# Patient Record
Sex: Male | Born: 1990 | Hispanic: Yes | Marital: Single | Smoking: Current every day smoker
Health system: Southern US, Community
[De-identification: ages and names within clinical notes are randomized; demographics above are authoritative.]

---

## 2009-04-30 ENCOUNTER — Emergency Department (HOSPITAL_COMMUNITY): Admission: EM | Admit: 2009-04-30 | Discharge: 2009-04-30 | Payer: Self-pay | Admitting: Emergency Medicine

## 2009-07-16 ENCOUNTER — Emergency Department (HOSPITAL_COMMUNITY): Admission: EM | Admit: 2009-07-16 | Discharge: 2009-07-16 | Payer: Self-pay | Admitting: Emergency Medicine

## 2009-07-21 ENCOUNTER — Emergency Department (HOSPITAL_COMMUNITY): Admission: EM | Admit: 2009-07-21 | Discharge: 2009-07-21 | Payer: Self-pay | Admitting: Emergency Medicine

## 2009-08-10 ENCOUNTER — Emergency Department (HOSPITAL_COMMUNITY): Admission: EM | Admit: 2009-08-10 | Discharge: 2009-08-10 | Payer: Self-pay | Admitting: Emergency Medicine

## 2010-01-20 ENCOUNTER — Emergency Department (HOSPITAL_COMMUNITY): Admission: EM | Admit: 2010-01-20 | Discharge: 2010-01-20 | Payer: Self-pay | Admitting: Emergency Medicine

## 2010-11-08 IMAGING — CR DG CHEST 2V
2 series · 2 of 2 positions shown · non-contrast
Comparison: None

CLINICAL DATA: Chest pain and shortness of breath; asthma and
history of smoking.

CHEST - 2 VIEW

[w chest pa]
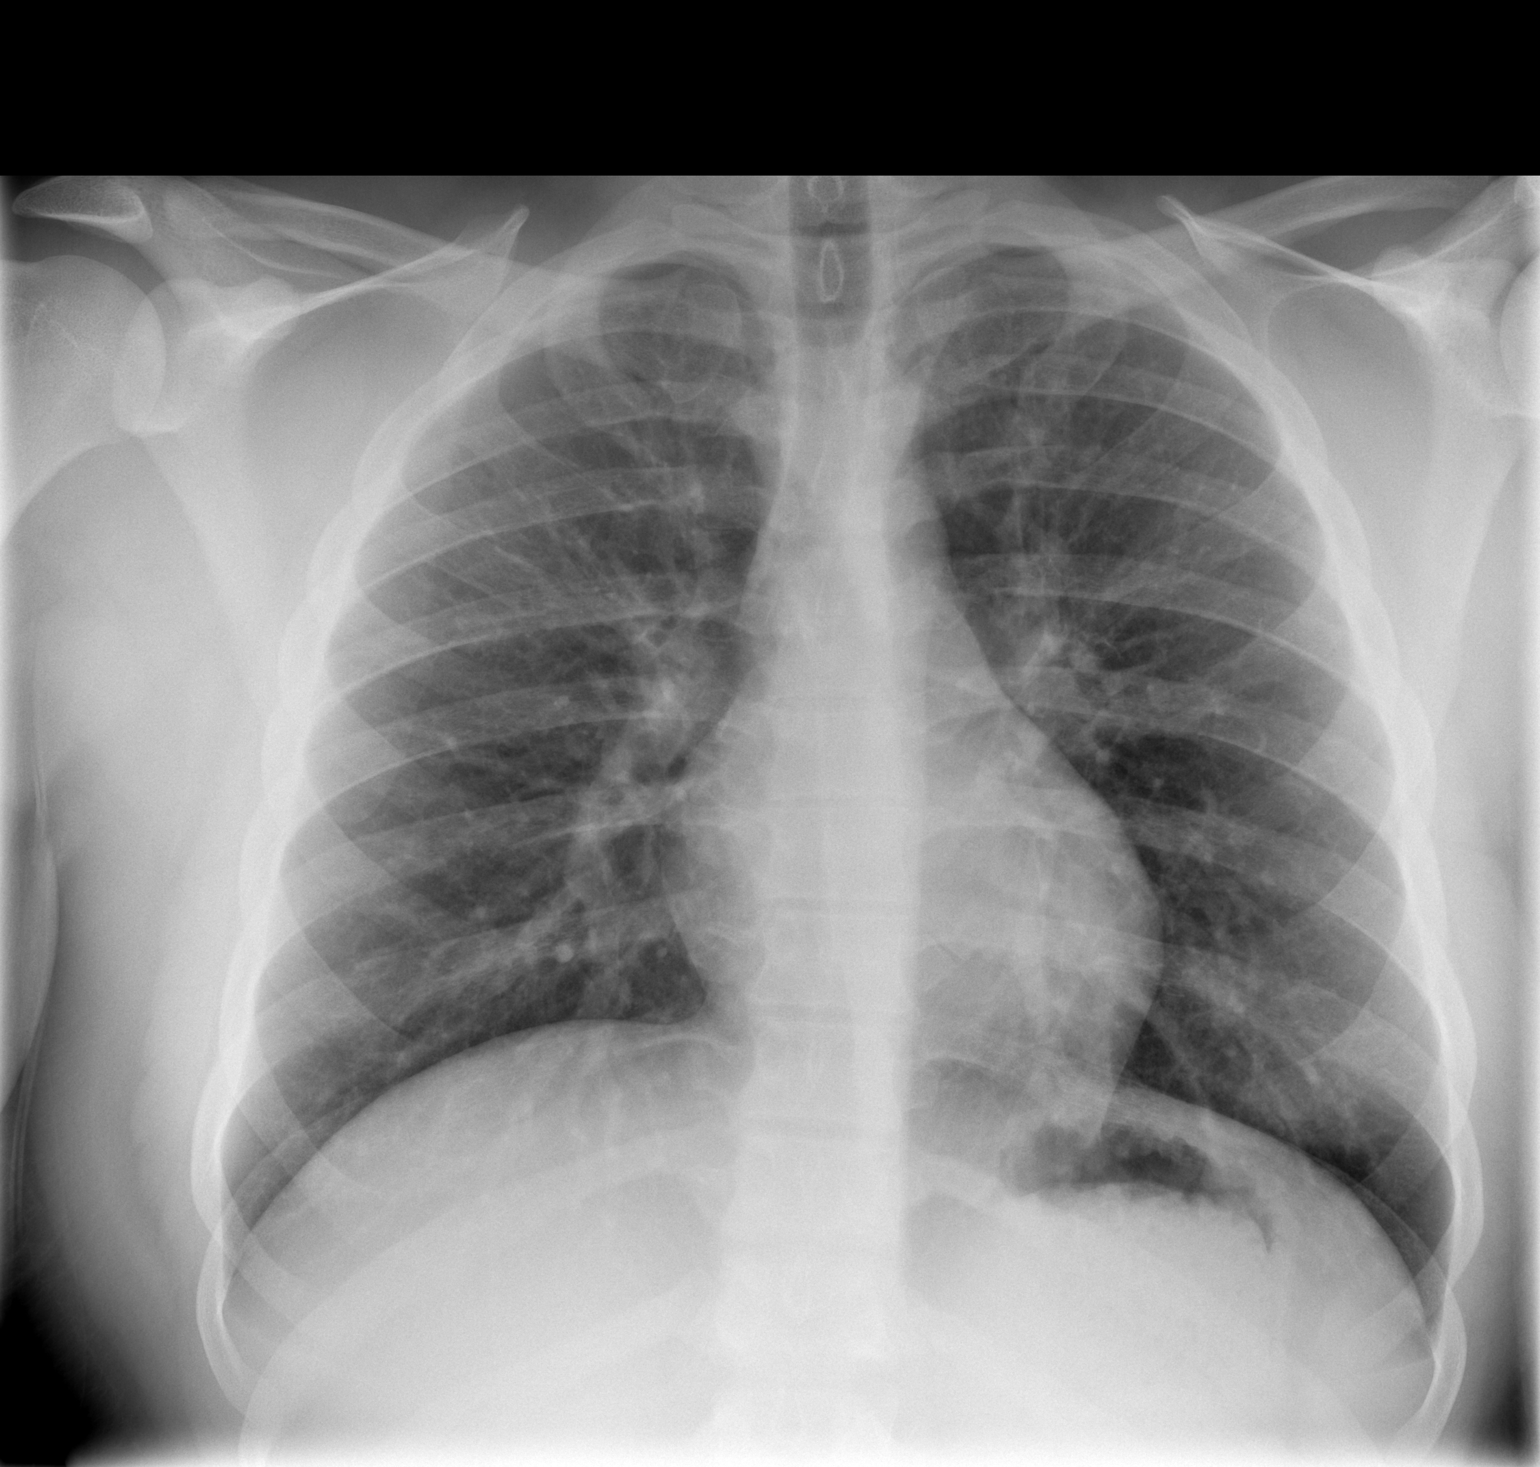

[w chest lat]
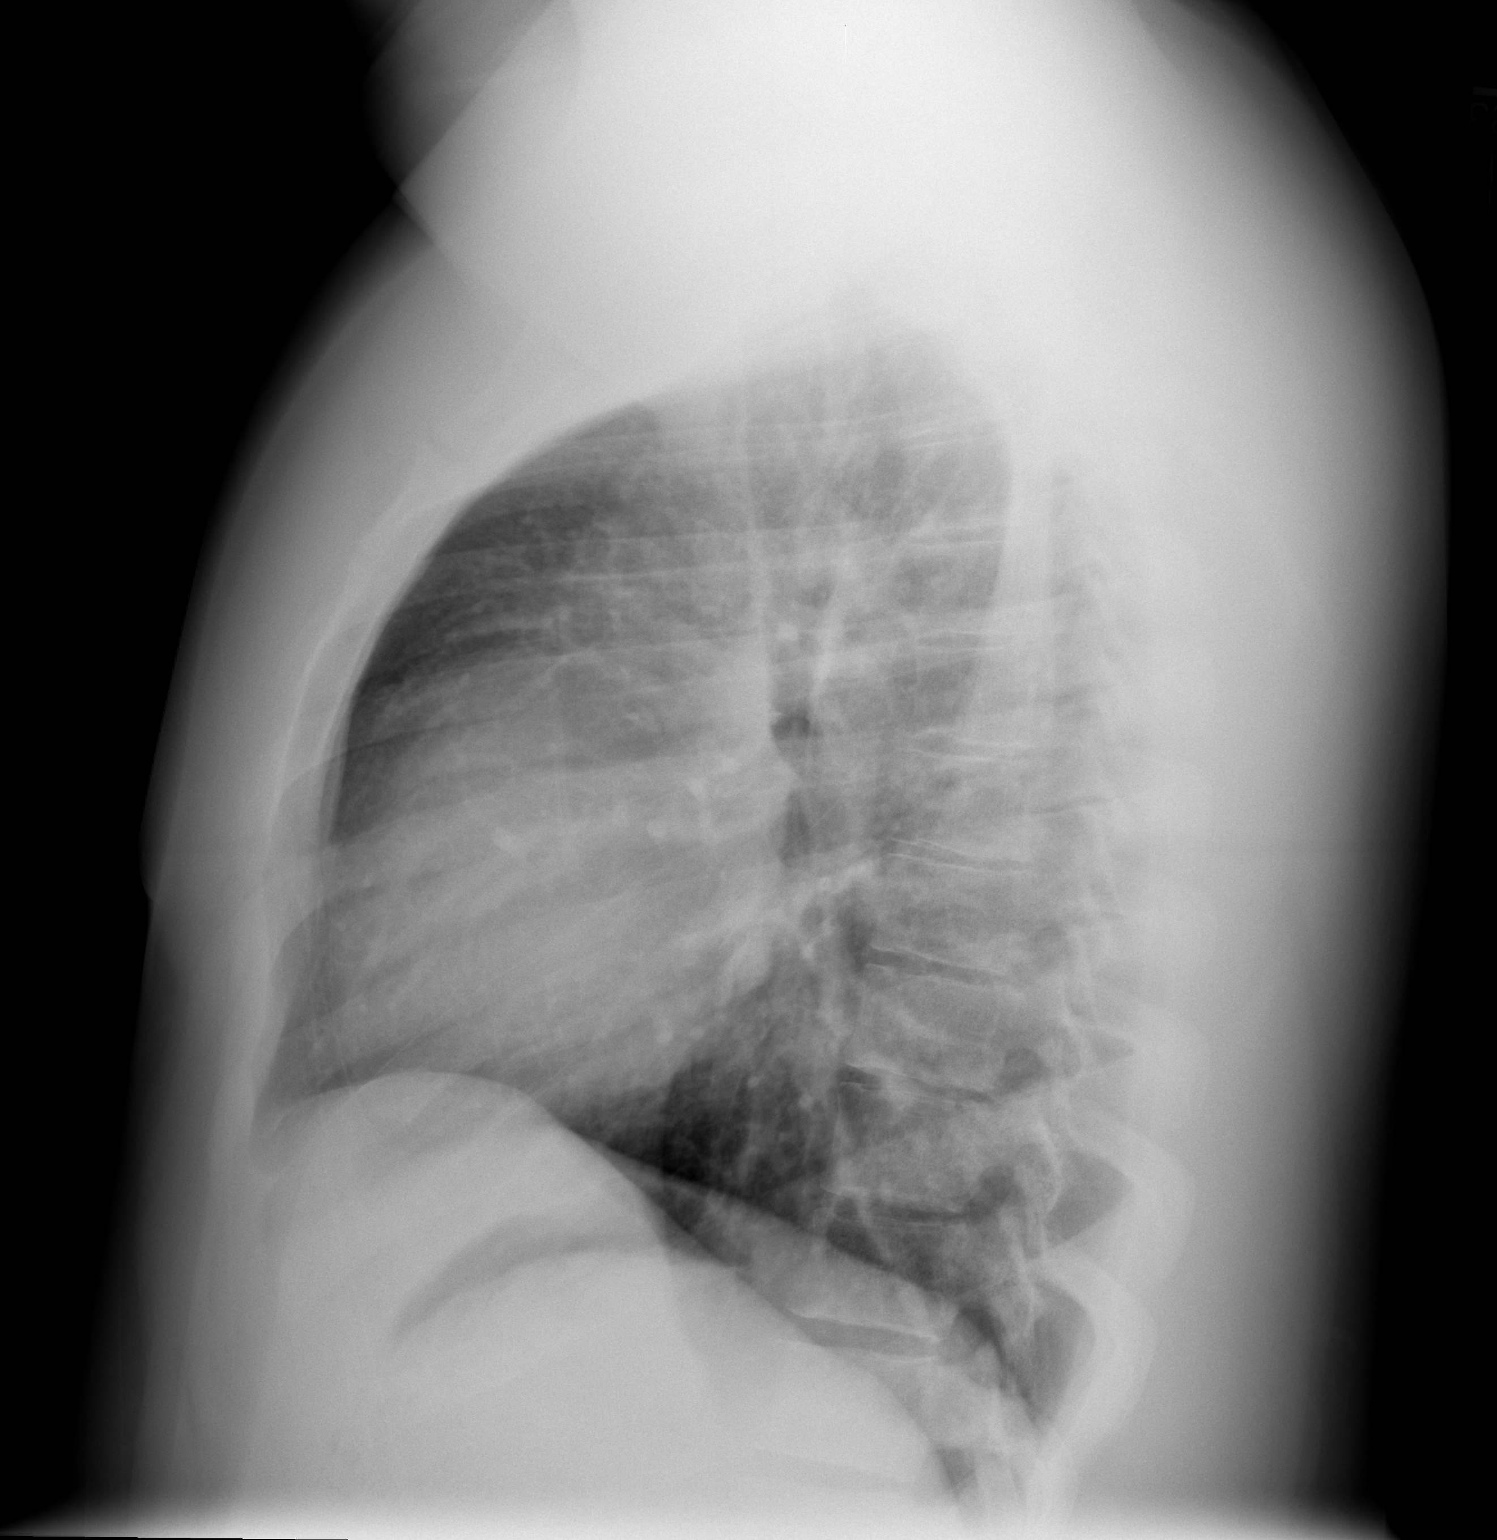

[2 of 2 positions shown; findings below may reference images not displayed]

FINDINGS: The lungs are well-aerated and clear.  Mild interstitial
prominence is noted, in the upper limits of normal.  There is no
evidence of focal opacification, pleural effusion or pneumothorax.

The heart is normal in size; the mediastinal contour is within
normal limits.  No acute osseous abnormalities are seen.
IMPRESSION: No acute cardiopulmonary process seen.

## 2010-11-22 LAB — URINALYSIS, ROUTINE W REFLEX MICROSCOPIC
Bilirubin Urine: NEGATIVE
Glucose, UA: NEGATIVE mg/dL
Hgb urine dipstick: NEGATIVE
Ketones, ur: NEGATIVE mg/dL
Protein, ur: NEGATIVE mg/dL
Specific Gravity, Urine: 1.024 (ref 1.005–1.030)
Urobilinogen, UA: 0.2 mg/dL (ref 0.0–1.0)
pH: 5.5 (ref 5.0–8.0)

## 2010-11-22 LAB — COMPREHENSIVE METABOLIC PANEL
ALT: 34 U/L (ref 0–53)
AST: 24 U/L (ref 0–37)
Albumin: 4.3 g/dL (ref 3.5–5.2)
Alkaline Phosphatase: 89 U/L (ref 39–117)
CO2: 28 mEq/L (ref 19–32)
Calcium: 9.8 mg/dL (ref 8.4–10.5)
Creatinine, Ser: 0.92 mg/dL (ref 0.4–1.5)
Glucose, Bld: 82 mg/dL (ref 70–99)
Potassium: 4.5 mEq/L (ref 3.5–5.1)
Total Bilirubin: 0.8 mg/dL (ref 0.3–1.2)

## 2010-11-22 LAB — DIFFERENTIAL
Basophils Absolute: 0 10*3/uL (ref 0.0–0.1)
Basophils Relative: 0 % (ref 0–1)
Eosinophils Relative: 1 % (ref 0–5)
Lymphocytes Relative: 34 % (ref 12–46)
Lymphs Abs: 1.7 10*3/uL (ref 0.7–4.0)
Monocytes Absolute: 0.3 10*3/uL (ref 0.1–1.0)
Neutrophils Relative %: 58 % (ref 43–77)

## 2010-11-22 LAB — CBC
HCT: 43.3 % (ref 39.0–52.0)
RDW: 12.6 % (ref 11.5–15.5)

## 2010-12-07 LAB — URINALYSIS, ROUTINE W REFLEX MICROSCOPIC
Bilirubin Urine: NEGATIVE
Specific Gravity, Urine: 1.02 (ref 1.005–1.030)
pH: 5.5 (ref 5.0–8.0)

## 2010-12-07 LAB — URINE MICROSCOPIC-ADD ON

## 2010-12-07 LAB — GC/CHLAMYDIA PROBE AMP, GENITAL
Chlamydia, DNA Probe: NEGATIVE
GC Probe Amp, Genital: NEGATIVE

## 2010-12-08 LAB — URINALYSIS, ROUTINE W REFLEX MICROSCOPIC
Bilirubin Urine: NEGATIVE
Glucose, UA: NEGATIVE mg/dL
Specific Gravity, Urine: 1.017 (ref 1.005–1.030)

## 2010-12-08 LAB — DIFFERENTIAL
Basophils Absolute: 0 10*3/uL (ref 0.0–0.1)
Basophils Relative: 0 % (ref 0–1)
Eosinophils Absolute: 0.1 10*3/uL (ref 0.0–0.7)
Eosinophils Relative: 1 % (ref 0–5)
Monocytes Relative: 7 % (ref 3–12)

## 2010-12-08 LAB — POCT I-STAT, CHEM 8
BUN: 15 mg/dL (ref 6–23)
Chloride: 102 mEq/L (ref 96–112)
Sodium: 140 mEq/L (ref 135–145)
TCO2: 26 mmol/L (ref 0–100)

## 2010-12-08 LAB — CBC
HCT: 41.9 % (ref 39.0–52.0)
Hemoglobin: 14.6 g/dL (ref 13.0–17.0)
Platelets: 163 10*3/uL (ref 150–400)
RBC: 4.63 MIL/uL (ref 4.22–5.81)
RDW: 12.8 % (ref 11.5–15.5)
WBC: 6 10*3/uL (ref 4.0–10.5)

## 2010-12-11 LAB — BASIC METABOLIC PANEL
BUN: 11 mg/dL (ref 6–23)
CO2: 28 mEq/L (ref 19–32)
Calcium: 9.5 mg/dL (ref 8.4–10.5)
Creatinine, Ser: 1.07 mg/dL (ref 0.4–1.5)
GFR calc non Af Amer: >60 mL/min (ref >60)
Glucose, Bld: 97 mg/dL (ref 70–99)
Sodium: 139 mEq/L (ref 135–145)

## 2010-12-11 LAB — DIFFERENTIAL
Eosinophils Absolute: 0 10*3/uL (ref 0.0–0.7)
Lymphocytes Relative: 21 % (ref 12–46)
Lymphs Abs: 1.1 10*3/uL (ref 0.7–4.0)
Monocytes Relative: 8 % (ref 3–12)
Neutrophils Relative %: 70 % (ref 43–77)

## 2010-12-11 LAB — D-DIMER, QUANTITATIVE: D-Dimer, Quant: 0.38 ug/mL-FEU (ref 0.00–0.48)

## 2010-12-11 LAB — CBC
RDW: 12.8 % (ref 11.5–15.5)
WBC: 5.4 10*3/uL (ref 4.0–10.5)

## 2016-10-31 ENCOUNTER — Emergency Department (HOSPITAL_COMMUNITY): Payer: Self-pay

## 2016-10-31 ENCOUNTER — Emergency Department (HOSPITAL_COMMUNITY)
Admission: EM | Admit: 2016-10-31 | Discharge: 2016-10-31 | Disposition: A | Discharge status: Home / Self Care | Payer: Self-pay | Attending: Emergency Medicine | Admitting: Emergency Medicine

## 2016-10-31 ENCOUNTER — Encounter (HOSPITAL_COMMUNITY): Payer: Self-pay | Admitting: Emergency Medicine

## 2016-10-31 DIAGNOSIS — Y929 Unspecified place or not applicable: Secondary | ICD-10-CM | POA: Insufficient documentation

## 2016-10-31 DIAGNOSIS — S62336A Displaced fracture of neck of fifth metacarpal bone, right hand, initial encounter for closed fracture: Secondary | ICD-10-CM | POA: Insufficient documentation

## 2016-10-31 DIAGNOSIS — W010XXA Fall on same level from slipping, tripping and stumbling without subsequent striking against object, initial encounter: Secondary | ICD-10-CM | POA: Insufficient documentation

## 2016-10-31 DIAGNOSIS — Y990 Civilian activity done for income or pay: Secondary | ICD-10-CM | POA: Insufficient documentation

## 2016-10-31 DIAGNOSIS — Y939 Activity, unspecified: Secondary | ICD-10-CM | POA: Insufficient documentation

## 2016-10-31 MED ORDER — IBUPROFEN 400 MG PO TABS
400.0000 mg | ORAL_TABLET | Freq: Once | ORAL | Status: AC
  Administered 2016-10-31: 400 mg via ORAL
  Filled 2016-10-31: qty 1

## 2016-10-31 NOTE — ED Triage Notes (Signed)
Pt reports tripping and falling on his right hand Friday, pain and swelling to hand.

## 2016-10-31 NOTE — Discharge Instructions (Signed)
It was our pleasure to provide your ER care today - we hope that you feel better.  Keep splint clean and dry.  Elevate hand.   Take tylenol and/or motrin as need for pain.  Follow up with hand specialist in the next few days - see referral - call office this afternoon, or tomorrow morning, to arrange appointment.   Return to ER if worse, new symptoms, other emergency.

## 2016-10-31 NOTE — ED Provider Notes (Signed)
[facility]-EMERGENCY DEPT Provider Note   CSN: [id number] Arrival date & time: 10/31/16  1326  By signing my name below, I, [name], attest that this documentation has been prepared under the direction and in the presence of Cathren Laine, MD . Electronically Signed: [name], Scribe. 10/31/2016. 2:06 PM.    History   Chief Complaint Chief Complaint  Patient presents with  . Hand Injury    The history is provided by the patient. No language interpreter was used.   HPI Comments:  Curtis Monroe is a 26 y.o. male with no Pertinent PMHx, who presents to the Emergency Department complaining of constant, moderate, right hand pain onset three days ago. 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 He has an associated symptom of swelling to the affected right hand. He did not note anything taken at home PTA for relief of his symptoms. Direct pressure and palpation exacerbate his pain. Pt denies fever, open wound, numbness, and any other injuries at this time.   History reviewed. No pertinent past medical history.  There are no active problems to display for this patient.   History reviewed. No pertinent surgical history.     Home Medications    Prior to Admission medications   Not on File    Family History No family history on file.  Social History Social History  Substance Use Topics  . Smoking status: Not on file  . Smokeless tobacco: Not on file  . Alcohol use Not on file     Allergies   Patient has no known allergies.   Review of Systems Review of Systems  Constitutional: Positive for fever.  Musculoskeletal: Positive for joint swelling and myalgias.  Skin: Negative for wound.  Neurological: Negative for numbness.     Physical Exam Updated Vital Signs BP 138/78   Pulse 90   Temp 98.9 F (37.2 C) (Oral)   Resp 16    SpO2 98%   Physical Exam  Constitutional: He appears well-developed and well-nourished.  HENT:  Head: Normocephalic.  Eyes: Conjunctivae are normal.  Cardiovascular: Normal rate and intact distal pulses.   Pulmonary/Chest: Effort normal.  Abdominal: He exhibits no distension.  Musculoskeletal: Normal range of motion.  Moderate swelling and tenderness to the right fourth and fifth metacarpal. Skin is intact. No sign of infection. Normal cap refill in digits of hand.   Neurological: He is alert.  Right hand, motor 5/5. sens intact.   Skin: Skin is warm and dry. No rash noted.  Psychiatric: He has a normal mood and affect.  Nursing note and vitals reviewed.    ED Treatments / Results  DIAGNOSTIC STUDIES:  Oxygen Saturation is 98% on RA, normal by my interpretation.    COORDINATION OF CARE:  2:03 PM Discussed treatment plan with pt at bedside including X-Ray of right hand and pt agreed to plan.  Labs (all labs ordered are listed, but only abnormal results are displayed) Labs Reviewed - No data to display  EKG  EKG Interpretation None       Radiology Dg Hand Complete Right  Result Date: 10/31/2016 CLINICAL DATA:  Pt was at work on Friday and he fell, landing on his right hand. He has posterior 4th and 5th metacarpal swelling and pain. No known Hx of fracture of the hand, and no Hx of surgery on  the right hand EXAM: RIGHT HAND - COMPLETE 3+ VIEW COMPARISON:  None. FINDINGS: Boxer's fracture, right fifth metacarpal, minimally comminuted. Mild palmar displacement and angulation of the distal fracture fragment. Overlying soft tissue swelling. Otherwise normal mineralization and alignment. No significant osseous degenerative change. IMPRESSION: Angulated minimally comminuted boxer's fracture, right fifth metacarpal. Electronically Signed   By: Corlis Leak  Hassell M.D.   On: 10/31/2016 14:06    Procedures Procedures (including critical care time)  Medications Ordered in ED Medications -  No data to display   Initial Impression / Assessment and Plan / ED Course  I have reviewed the triage vital signs and the nursing notes.  Pertinent labs & imaging results that were available during my care of the patient were reviewed by me and considered in my medical decision making (see chart for details).  Discussed xrays w pt.   Ulnar gutter splint.   Icepack.   Motrin po  Will have f/u ortho hand.     Final Clinical Impressions(s) / ED Diagnoses   Final diagnoses:  None    New Prescriptions New Prescriptions   No medications on file   I personally performed the services described in this documentation, which was scribed in my presence. The recorded information has been reviewed and considered. Cathren LaineKevin Tonga Prout, MD     Cathren LaineKevin Jacie Tristan, MD 10/31/16 [phone]

## 2016-10-31 NOTE — ED Notes (Signed)
Ortho called for Ulnar Gutter Splint

## 2016-10-31 NOTE — Progress Notes (Signed)
Orthopedic Tech Progress Note Patient Details:  Curtis MunchJonathan Morales Monroe 01/09/1991 [id number]  Ortho Devices Type of Ortho Device: Ace wrap, Ulna gutter splint Ortho Device/Splint Location: rue Ortho Device/Splint Interventions: Application   [name] 10/31/2016, 3:00 PM

## 2016-10-31 NOTE — ED Notes (Signed)
Pt stable, understands discharge instructions, and reasons for return.   

## 2017-09-09 ENCOUNTER — Emergency Department (HOSPITAL_COMMUNITY)
Admission: EM | Admit: 2017-09-09 | Discharge: 2017-09-09 | Disposition: A | Discharge status: Home / Self Care | Payer: Self-pay | Attending: Emergency Medicine | Admitting: Emergency Medicine

## 2017-09-09 ENCOUNTER — Emergency Department (HOSPITAL_COMMUNITY): Payer: Self-pay

## 2017-09-09 ENCOUNTER — Encounter (HOSPITAL_COMMUNITY): Payer: Self-pay | Admitting: *Deleted

## 2017-09-09 ENCOUNTER — Other Ambulatory Visit: Payer: Self-pay

## 2017-09-09 DIAGNOSIS — L03115 Cellulitis of right lower limb: Secondary | ICD-10-CM | POA: Insufficient documentation

## 2017-09-09 DIAGNOSIS — L03116 Cellulitis of left lower limb: Secondary | ICD-10-CM | POA: Insufficient documentation

## 2017-09-09 DIAGNOSIS — F17210 Nicotine dependence, cigarettes, uncomplicated: Secondary | ICD-10-CM | POA: Insufficient documentation

## 2017-09-09 MED ORDER — SULFAMETHOXAZOLE-TRIMETHOPRIM 800-160 MG PO TABS: 1.0000 | ORAL_TABLET | Freq: Two times a day (BID) | ORAL | 0 refills | Status: AC

## 2017-09-09 MED ORDER — FLUCONAZOLE 150 MG PO TABS: 150.0000 mg | ORAL_TABLET | Freq: Every day | ORAL | 0 refills | Status: AC

## 2017-09-09 MED ORDER — NYSTATIN 100000 UNIT/GM EX CREA: TOPICAL_CREAM | CUTANEOUS | 0 refills | Status: AC

## 2017-09-09 MED ORDER — NAPROXEN 500 MG PO TABS: 500.0000 mg | ORAL_TABLET | Freq: Two times a day (BID) | ORAL | 0 refills | Status: AC

## 2017-09-09 MED ORDER — OXYCODONE-ACETAMINOPHEN 5-325 MG PO TABS
1.0000 | ORAL_TABLET | Freq: Once | ORAL | Status: AC
Start: 2017-09-09 — End: 2017-09-09
  Administered 2017-09-09: 1 via ORAL
  Filled 2017-09-09: qty 1

## 2017-09-09 NOTE — ED Triage Notes (Signed)
Pt reports getting his feet wet at work several days ago and now has swelling and rash to bilateral toes. No redness noted to his feet, rash noted to toes. Ambulatory at triage.

## 2017-09-09 NOTE — ED Notes (Signed)
Declined W/C at D/C and was escorted to lobby by RN. 

## 2017-09-09 NOTE — ED Notes (Signed)
Rash to bilateral feet after standing in water and feet being wet at work, NAD

## 2017-09-09 NOTE — Discharge Instructions (Signed)
You were seen in the emergency department for pain/redness/swelling to your feet. Your x-ray did not show any acute abnormalities.   We are concerned that the feet are infected and are treating you for both a bacterial and fungal infection. I have prescribed you multiple medications as below:   - Diflucan-this is an oral antifungal.  Take 1 pill of this today, then take the other pill in 1 week. - Nystatin cream-is a topical antifungal cream, apply this to your toes twice per day. - Naproxen-is a nonsteroidal anti-inflammatory medication.  You may take this once every 12 hours as needed for pain and swelling, be sure to take this with food as it can cause stomach upset and at worst stomach bleeding.  You may supplement with Tylenol.  Do not take other NSAIDs with this medicine such as Motrin, advil, or aleve.  - Bactrim-is an antibiotic.  Take this twice per day. Please take all of your antibiotics until finished. You may develop abdominal discomfort or diarrhea from the antibiotic.  You may help offset this with probiotics which you can buy at the store (ask your pharmacist if unable to find) or get probiotics in the form of eating yogurt. Do not eat or take the probiotics until 2 hours after your antibiotic. If you are unable to tolerate these side effects follow-up with your primary care provider or return to the emergency department.   If you begin to experience any blistering, rashes, swelling, or difficulty breathing seek medical care for evaluation of potentially more serious side effects.   Please be aware that this medication may interact with other medications you are taking, please be sure to discuss your medication list with your pharmacist.    It is essential that you keep your feet clean and dry.  Whenever home be sure to take your shoes off and socks off and let your feet air out.  Place gauze between the toes in order to air out the space between the toes. Do not let the feet soak in  water or wet shoes.    You will need to follow-up with a primary care provider within the next 5 days.  I provided information for primary care provider in the area as well as our community clinic.  Call on Monday in order to make an appointment for Wednesday if possible.  Return to the emergency department for any new or worsening symptoms including but not limited to fever, chills, pain/swelling/redness spreading, or any other concerns.

## 2017-09-09 NOTE — ED Provider Notes (Signed)
[facility] EMERGENCY DEPARTMENT Provider Note   CSN: [id number] Arrival date & time: 09/09/17  0848     History   Chief Complaint Chief Complaint  Patient presents with  . Foot Pain    HPI Curtis Monroe is a 27 y.o. male without significant past medical history presents to the ED complaining of painful rash to bilateral toes that has been progressively worsening for past 2 days. States he works in a [employer] often get wet during work, stays in [employer]. Having painful rash with skin breakdown to all toes, worse between the digits. Has tried applying peroxide without relief. States toes feel somewhat numg. Denies fever, chills, nausea, or vomiting. No injury or open wound inciting symptoms.   HPI     History reviewed. No pertinent past medical history.  There are no active problems to display for this patient.   History reviewed. No pertinent surgical history.   Home Medications    Prior to Admission medications   Not on File    Family History History reviewed. No pertinent family history.  Social History Social History   Tobacco Use  . Smoking status: Current Every Day Smoker  Substance Use Topics  . Alcohol use: No    Frequency: Never  . Drug use: No     Allergies   Patient has no known allergies.   Review of Systems Review of Systems  Constitutional: Negative for chills and fever.  Musculoskeletal:       Pain redness and swelling to bilateral lower extremity digits.   Neurological: Positive for numbness (to toes). Negative for weakness.     Physical Exam Updated Vital Signs BP 119/78 (BP Location: Right Arm)   Pulse 84   Temp 98.7 F (37.1 C) (Oral)   Resp 16   SpO2 97%   Physical Exam  Constitutional: He appears well-developed and well-nourished. No distress.  HENT:  Head: Normocephalic and atraumatic.  Eyes: Conjunctivae are normal. Right eye exhibits no discharge. Left eye exhibits no discharge.    Cardiovascular:  Pulses:      Dorsalis pedis pulses are 2+ on the right side, and 2+ on the left side.  Musculoskeletal:  Lower extremities: Patient with erythema to all digits. There is significant maceration between digits with skin breakdown. There is a purulent discharge between 1st and 2nd toe to the R foot. Patient has swelling with tenderness to palpation to all digits extending to mid foot.   Neurological: He is alert.  Clear speech. Sensation grossly intact to lower extremities.   Skin: Capillary refill takes less than 2 seconds.  Psychiatric: He has a normal mood and affect. His behavior is normal. Thought content normal.  Nursing note and vitals reviewed.          ED Treatments / Results  Labs (all labs ordered are listed, but only abnormal results are displayed) Labs Reviewed - No data to display  EKG  EKG Interpretation None      Radiology Dg Foot Complete Left  Result Date: 09/09/2017 CLINICAL DATA:  Pt reports getting his feet wet at work several days ago and now has swelling and rash to bilateral toes. No redness noted to his feet, rash noted to toes. Pt also have open sores on both feet with bilateral drainage. EXAM: LEFT FOOT - COMPLETE 3+ VIEW COMPARISON:  None. FINDINGS: There is no evidence of fracture or dislocation. There is no evidence of arthropathy or other focal bone abnormality. Soft tissues  are unremarkable. IMPRESSION: No acute osseous injury of the left foot. Electronically Signed   By: Elige KoHetal  Patel   On: 09/09/2017 11:20   Dg Foot Complete Right  Result Date: 09/09/2017 CLINICAL DATA:  Pt reports getting his feet wet at work several days ago and now has swelling and rash to bilateral toes. No redness noted to his feet, rash noted to toes. Pt also have open sores on both feet with bilateral drainage. EXAM: RIGHT FOOT COMPLETE - 3+ VIEW COMPARISON:  None. FINDINGS: There is no evidence of fracture or dislocation. There is no evidence of arthropathy  or other focal bone abnormality. Soft tissues are unremarkable. IMPRESSION: No acute osseous injury of the right. Electronically Signed   By: Elige KoHetal  Patel   On: 09/09/2017 11:18    Procedures Procedures (including critical care time)  Medications Ordered in ED Medications  oxyCODONE-acetaminophen (PERCOCET/ROXICET) 5-325 MG per tablet 1 tablet (not administered)     Initial Impression / Assessment and Plan / ED Course  I have reviewed the triage vital signs and the nursing notes.  Pertinent labs & imaging results that were available during my care of the patient were reviewed by me and considered in my medical decision making (see chart for details).   Patient presents with erythema, swelling, pain, and discharge to bilateral toes. Patient is nontoxic appearing, vitals WNL. X-rays negative for acute abnormality. Of note patient is afebrile. Will cover for bacterial and fungal infection with Bactrim, Diflucan, and Nystatin. Gave patient strict foot hygiene instructions to keep the feet clean and dry as well as to put gauze between the toes. Discussed importance of elevation and taking off shoes/socks in order to allow for drying out whenever home.  Findings and plan of care discussed with supervising physician Dr. Donnald GarrePfeiffer who is in agreement with plan. I discussed results, treatment plan, need for PCP follow-up, and return precautions with the patient. Provided opportunity for questions, patient confirmed understanding and is in agreement with plan.   Final Clinical Impressions(s) / ED Diagnoses   Final diagnoses:  Cellulitis of both feet    ED Discharge Orders        Ordered    sulfamethoxazole-trimethoprim (BACTRIM DS,SEPTRA DS) 800-160 MG tablet  2 times daily     09/09/17 1200    fluconazole (DIFLUCAN) 150 MG tablet  Daily     09/09/17 1200    nystatin cream (MYCOSTATIN)     09/09/17 1200    naproxen (NAPROSYN) 500 MG tablet  2 times daily     09/09/17 1200        Zeven Kocak, Vineyard LakeSamantha R, PA-C 09/09/17 1818    Arby BarrettePfeiffer, Marcy, MD 09/18/17 1437
